# Patient Record
Sex: Female | Born: 2000 | Race: Black or African American | Hispanic: No | Marital: Single | Smoking: Never smoker
Health system: Southern US, Community
[De-identification: ages and names within clinical notes are randomized; demographics above are authoritative.]

## PROBLEM LIST (undated history)

## (undated) DIAGNOSIS — Z789 Other specified health status: Secondary | ICD-10-CM

## (undated) HISTORY — PX: OTHER SURGICAL HISTORY: SHX169

## (undated) HISTORY — PX: NO PAST SURGERIES: SHX2092

## (undated) HISTORY — DX: Other specified health status: Z78.9

---
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
## 2022-12-14 ENCOUNTER — Ambulatory Visit (HOSPITAL_COMMUNITY)
Admission: EM | Admit: 2022-12-14 | Discharge: 2022-12-14 | Disposition: A | Discharge status: Home / Self Care | Payer: Medicaid Other | Attending: Family Medicine | Admitting: Family Medicine

## 2022-12-14 ENCOUNTER — Encounter (HOSPITAL_COMMUNITY): Payer: Self-pay | Admitting: Emergency Medicine

## 2022-12-14 ENCOUNTER — Ambulatory Visit (INDEPENDENT_AMBULATORY_CARE_PROVIDER_SITE_OTHER): Payer: Medicaid Other

## 2022-12-14 DIAGNOSIS — R1310 Dysphagia, unspecified: Secondary | ICD-10-CM | POA: Diagnosis not present

## 2022-12-14 MED ORDER — OMEPRAZOLE 40 MG PO CPDR: 40.0000 mg | DELAYED_RELEASE_CAPSULE | Freq: Every day | ORAL | 1 refills | Status: AC

## 2022-12-14 NOTE — ED Triage Notes (Signed)
Pt reports for a week after eating or drinking will feel like something stuck in esophagus in upper mid chest.

## 2022-12-14 NOTE — ED Provider Notes (Addendum)
[facility]    CSN: [id number] Arrival date & time: 12/14/22  1109      History   Chief Complaint Chief Complaint  Patient presents with   feels like food and drink get stuck in esophagus    HPI Crystal Kelley is a 22 y.o. female.   HPI Here for feeling like food and liquid are getting stuck in her chest.  This began about a week ago.  She first states that it happens right after she has eaten.  She has never had to cough anything back up.  She last ate about 7 hours ago, but she still has a sensation currently while she is here in the office. No fever or cough or congestion.  No nausea or vomiting.  She has had some burping in the past but no sour waterbrash  .  Her periods are very irregular and last one was in December.   Past Medical History:  Diagnosis Date   Medical history non-contributory     Patient Active Problem List   Diagnosis Date Noted   Migraine without aura 03/07/2021    Past Surgical History:  Procedure Laterality Date   cyst throat     NO PAST SURGERIES      OB History     Gravida  0   Para  0   Term  0   Preterm  0   AB  0   Living  0      SAB  0   IAB  0   Ectopic  0   Multiple  0   Live Births  0            Home Medications    Prior to Admission medications   Medication Sig Start Date End Date Taking? Authorizing Provider  omeprazole (PRILOSEC) 40 MG capsule Take 1 capsule (40 mg total) by mouth daily. 12/14/22  Yes Barrett Henle, MD    Family History Family History  Problem Relation Age of Onset   Multiple sclerosis Mother     Social History Social History   Tobacco Use   Smoking status: Never   Smokeless tobacco: Never  Vaping Use   Vaping Use: Never used  Substance Use Topics   Alcohol use: Never   Drug use: Never     Allergies   Patient has no known allergies.   Review of Systems Review of Systems   Physical Exam Triage Vital Signs ED Triage Vitals  Enc Vitals  Group     BP 12/14/22 1323 106/73     Pulse Rate 12/14/22 1323 90     Resp 12/14/22 1323 16     Temp 12/14/22 1323 98.9 F (37.2 C)     Temp Source 12/14/22 1323 Oral     SpO2 12/14/22 1323 98 %     Weight --      Height --      Head Circumference --      Peak Flow --      Pain Score 12/14/22 1321 0     Pain Loc --      Pain Edu? --      Excl. in [place]? --    No data found.  Updated Vital Signs BP 106/73 (BP Location: Left Arm)   Pulse 90   Temp 98.9 F (37.2 C) (Oral)   Resp 16   SpO2 98%   Visual Acuity Right Eye Distance:   Left Eye Distance:   Bilateral Distance:  Right Eye Near:   Left Eye Near:    Bilateral Near:     Physical Exam Vitals reviewed.  Constitutional:      General: She is not in acute distress.    Appearance: She is not ill-appearing, toxic-appearing or diaphoretic.  HENT:     Nose: Nose normal.     Mouth/Throat:     Mouth: Mucous membranes are moist.     Pharynx: No oropharyngeal exudate or posterior oropharyngeal erythema.  Eyes:     Extraocular Movements: Extraocular movements intact.     Conjunctiva/sclera: Conjunctivae normal.     Pupils: Pupils are equal, round, and reactive to light.  Cardiovascular:     Rate and Rhythm: Normal rate and regular rhythm.     Heart sounds: No murmur heard. Pulmonary:     Effort: Pulmonary effort is normal. No respiratory distress.     Breath sounds: Normal breath sounds. No stridor. No wheezing, rhonchi or rales.  Chest:     Chest wall: No tenderness.  Abdominal:     Palpations: Abdomen is soft.     Tenderness: There is no abdominal tenderness.  Musculoskeletal:     Cervical back: Neck supple.  Lymphadenopathy:     Cervical: No cervical adenopathy.  Skin:    Coloration: Skin is not jaundiced or pale.  Neurological:     General: No focal deficit present.     Mental Status: She is alert.  Psychiatric:        Behavior: Behavior normal.      UC Treatments / Results  Labs (all labs ordered  are listed, but only abnormal results are displayed) Labs Reviewed - No data to display  EKG   Radiology DG Chest 2 View  Result Date: 12/14/2022 CLINICAL DATA:  Dysphagia EXAM: CHEST - 2 VIEW COMPARISON:  None Available. FINDINGS: The heart size and mediastinal contours are within normal limits. Both lungs are clear. The visualized skeletal structures are unremarkable. IMPRESSION: No active cardiopulmonary disease. Electronically Signed   By: Yetta Glassman M.D.   On: 12/14/2022 14:24    Procedures Procedures (including critical care time)  Medications Ordered in UC Medications - No data to display  Initial Impression / Assessment and Plan / UC Course  I have reviewed the triage vital signs and the nursing notes.  Pertinent labs & imaging results that were available during my care of the patient were reviewed by me and considered in my medical decision making (see chart for details).        Chest x-ray is clear.  She is prescribed proton pump inhibitor and I am going to have her use some antiacid as needed.  She is given contact information for gastroenterology, and I have asked her to please see her primary care  If she worse in any way, including not being able to pass fluids, she will present to the emergency room for further evaluation Final Clinical Impressions(s) / UC Diagnoses   Final diagnoses:  Dysphagia, unspecified type     Discharge Instructions      Your chest x-ray was clear and did not show any abnormality  Take omeprazole 40 mg--1 capsule daily for stomach acid.  I would also have you get some liquid antacid like Maalox or Mylanta and take as needed.  Please follow-up with your primary care office about this issue  Also, if you get worse, such as not being able to pass any fluids or food into your stomach, or if you are vomiting, please  present to the emergency room for further evaluation     ED Prescriptions     Medication Sig Dispense Auth.  Provider   omeprazole (PRILOSEC) 40 MG capsule Take 1 capsule (40 mg total) by mouth daily. 30 capsule Barrett Henle, MD      PDMP not reviewed this encounter.   Barrett Henle, MD 12/14/22 1431    Barrett Henle, MD 12/14/22 [phone]

## 2022-12-14 NOTE — Discharge Instructions (Signed)
Your chest x-ray was clear and did not show any abnormality  Take omeprazole 40 mg--1 capsule daily for stomach acid.  I would also have you get some liquid antacid like Maalox or Mylanta and take as needed.  Please follow-up with your primary care office about this issue  Also, if you get worse, such as not being able to pass any fluids or food into your stomach, or if you are vomiting, please present to the emergency room for further evaluation
